# Patient Record
Sex: Female | Born: 1990 | Race: White | Hispanic: No | Marital: Single | Smoking: Never smoker
Health system: Southern US, Community
[De-identification: ages and names within clinical notes are randomized; demographics above are authoritative.]

---
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
## 2012-01-11 ENCOUNTER — Emergency Department (INDEPENDENT_AMBULATORY_CARE_PROVIDER_SITE_OTHER)
Admission: EM | Admit: 2012-01-11 | Discharge: 2012-01-11 | Disposition: A | Discharge status: Home / Self Care | Payer: 59 | Source: Home / Self Care | Attending: Family Medicine | Admitting: Family Medicine

## 2012-01-11 ENCOUNTER — Encounter: Payer: Self-pay | Admitting: *Deleted

## 2012-01-11 DIAGNOSIS — H6690 Otitis media, unspecified, unspecified ear: Secondary | ICD-10-CM

## 2012-01-11 DIAGNOSIS — J069 Acute upper respiratory infection, unspecified: Secondary | ICD-10-CM

## 2012-01-11 DIAGNOSIS — H6693 Otitis media, unspecified, bilateral: Secondary | ICD-10-CM

## 2012-01-11 MED ORDER — BENZONATATE 200 MG PO CAPS: 200.0000 mg | ORAL_CAPSULE | Freq: Every day | ORAL | Status: AC

## 2012-01-11 MED ORDER — AMOXICILLIN 875 MG PO TABS: 875.0000 mg | ORAL_TABLET | Freq: Two times a day (BID) | ORAL | Status: AC

## 2012-01-11 NOTE — ED Notes (Signed)
Patient c/o dry cough, congestion, ear pain and sore throat x 3 days. Taken tylenol cold/flu OTC. Fever 2 days ago. Did not receive a flu vaccine this year.

## 2012-01-11 NOTE — ED Provider Notes (Signed)
History     CSN: [id number]  Arrival date & time 01/11/12  1123   First MD Initiated Contact with Patient 01/11/12 1312      Chief Complaint  Patient presents with  . URI     HPI Comments: Patient complains of approximately 3 day history of gradually progressive URI symptoms beginning with a mild sore throat (now improved), followed by progressive nasal congestion.  A cough started next.  Complains of fatigue but no myalgias.  Cough is now worse at night and generally non-productive during the day.  There has been no pleuritic pain, shortness of breath, or wheezes. She has now developed a left earache. She has a history of otitis media as a child.  Patient is a 21 y.o. female presenting with URI. The history is provided by the patient.  URI The primary symptoms include fatigue, headaches, ear pain, sore throat and cough. Primary symptoms do not include fever, wheezing, abdominal pain, nausea, vomiting, myalgias or rash. Episode onset: 3 days ago.  Symptoms associated with the illness include chills, plugged ear sensation, congestion and rhinorrhea. The illness is not associated with facial pain or sinus pressure.    History reviewed. No pertinent past medical history.  History reviewed. No pertinent past surgical history.  Family History  Problem Relation Age of Onset  . Thrombosis Father     History  Substance Use Topics  . Smoking status: Never Smoker   . Smokeless tobacco: Not on file  . Alcohol Use: No    OB History    Grav Para Term Preterm Abortions TAB SAB Ect Mult Living                  Review of Systems  Constitutional: Positive for chills and fatigue. Negative for fever.  HENT: Positive for ear pain, congestion, sore throat and rhinorrhea. Negative for sinus pressure.   Respiratory: Positive for cough. Negative for wheezing.   Gastrointestinal: Negative for nausea, vomiting and abdominal pain.  Musculoskeletal: Negative for myalgias.  Skin: Negative for  rash.  Neurological: Positive for headaches.   + sore throat + cough No pleuritic pain No wheezing + nasal congestion ? post-nasal drainage No sinus pain/pressure No itchy/red eyes + earache No hemoptysis No SOB No fever, + chills No nausea No vomiting No abdominal pain No diarrhea No urinary symptoms No skin rashes + fatigue No myalgias + headache Used OTC meds without relief  Allergies  Review of patient's allergies indicates no known allergies.  Home Medications   Current Outpatient Rx  Name Route Sig Dispense Refill  . AMOXICILLIN 875 MG PO TABS Oral Take 1 tablet (875 mg total) by mouth 2 (two) times daily. 20 tablet 0  . BENZONATATE 200 MG PO CAPS Oral Take 1 capsule (200 mg total) by mouth at bedtime. Take as needed for cough 12 capsule 0    BP 108/70  Pulse 102  Temp(Src) 98.9 F (37.2 C) (Oral)  Resp 16  Ht 5\' 7"  (1.702 m)  Wt 127 lb (57.607 kg)  BMI 19.89 kg/m2  SpO2 97%  Physical Exam Nursing notes and Vital Signs reviewed. Appearance:  Patient appears healthy, stated age, and in no acute distress Eyes:  Pupils are equal, round, and reactive to light and accomodation.  Extraocular movement is intact.  Conjunctivae are not inflamed  Ears:  Canals normal.   Right TM pink.  Left TM red and slightly bulging Nose:  Mildly congested turbinates.  No sinus tenderness.   Pharynx:  Normal Neck:  Supple.  Slightly tender shotty anterior/posterior nodes are palpated bilaterally  Lungs:  Clear to auscultation.  Breath sounds are equal.  Heart:  Regular rate and rhythm without murmurs, rubs, or gallops.  Abdomen:  Nontender without masses or hepatosplenomegaly.  Bowel sounds are present.  No CVA or flank tenderness.  Extremities:  No edema.  No calf tenderness Skin:  No rash present.   ED Course  Procedures  none      1. Acute upper respiratory infections of unspecified site   2. Otitis media of both ears       MDM  Begin amoxicillin for 10 days,  and Tessalon at bedtime. Take Mucinex D (guaifenesin with decongestant) twice daily for congestion.  Increase fluid intake, rest. May use Afrin nasal spray (or generic oxymetazoline) twice daily for about 5 days.  Also recommend using saline nasal spray several times daily and saline nasal irrigation. Stop all antihistamines for now, and other non-prescription cough/cold preparations. Follow-up with family doctor if not improving 7 to 10 days.         Donna Christen, MD 01/11/12 1335

## 2012-10-08 IMAGING — CR DG ANKLE COMPLETE 3+V*R*
3 series · 3 of 3 positions shown · non-contrast
Comparison: None.

CLINICAL DATA: Tenderness

RIGHT ANKLE - COMPLETE 3+ VIEW

[view not recorded (1 of 3)]
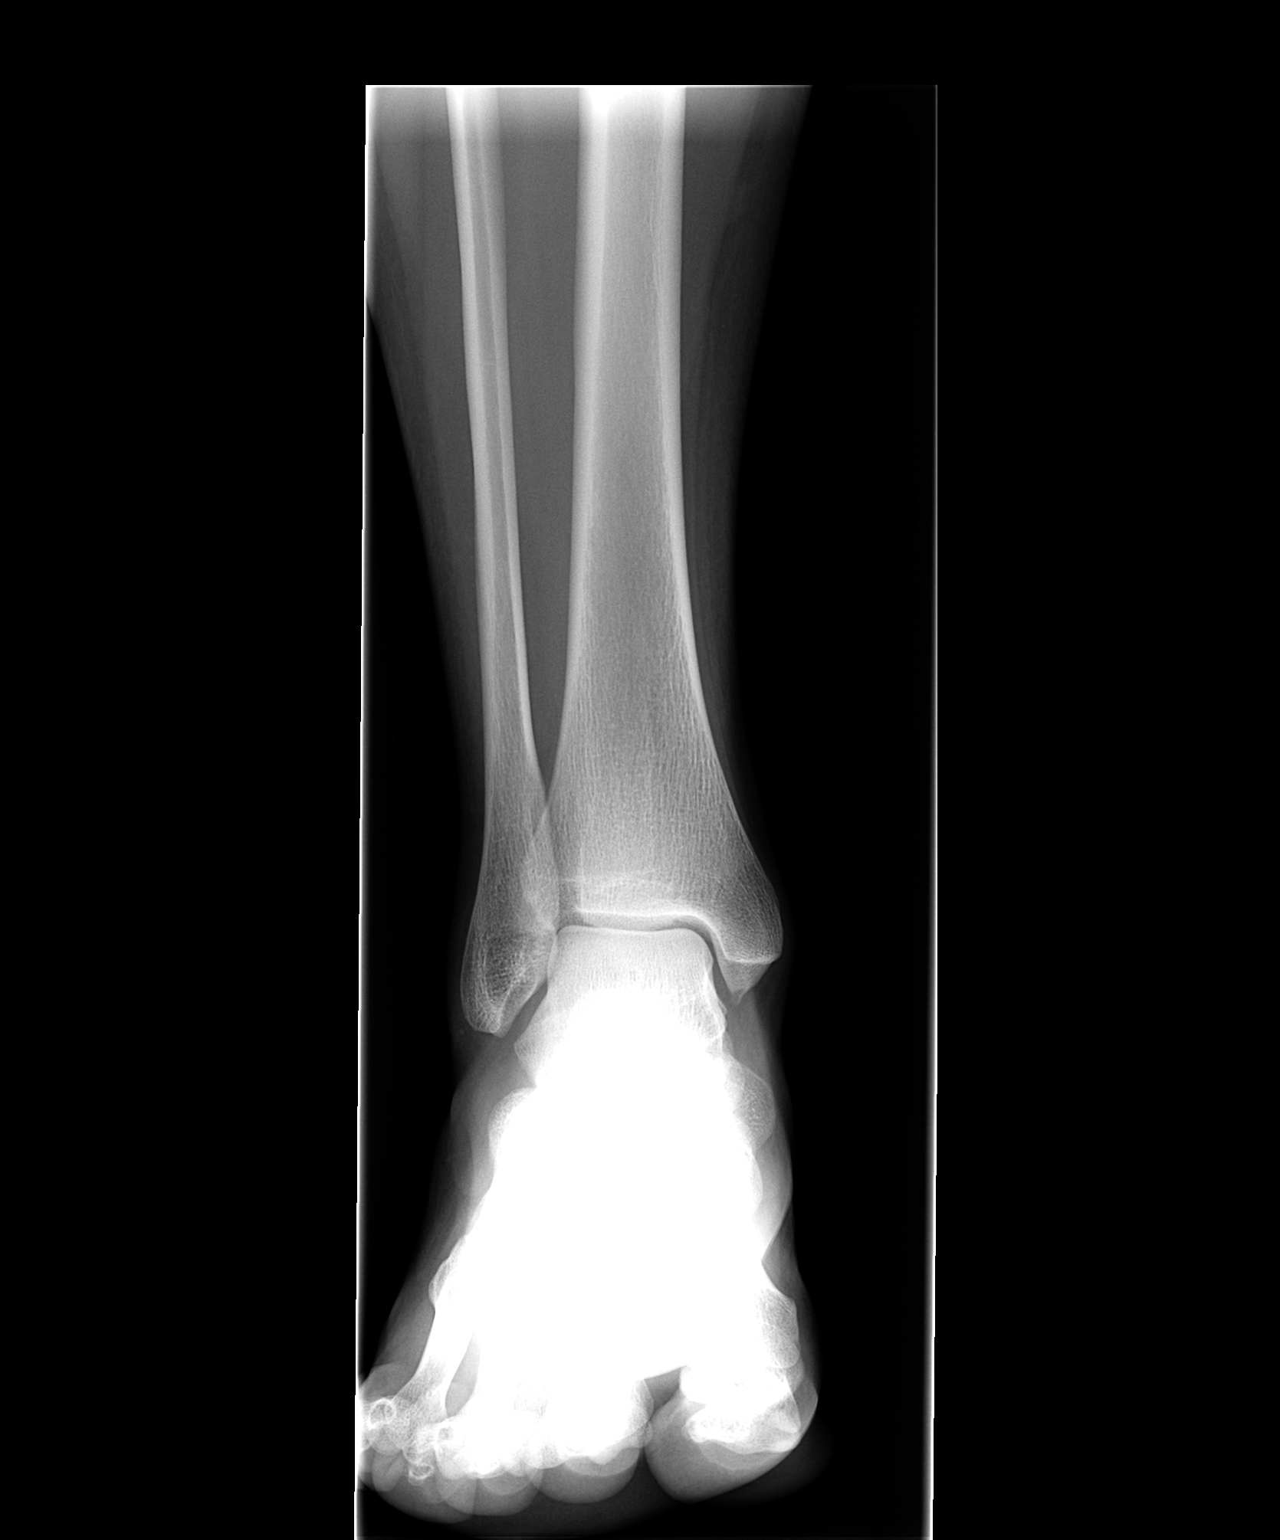

[view not recorded (2 of 3)]
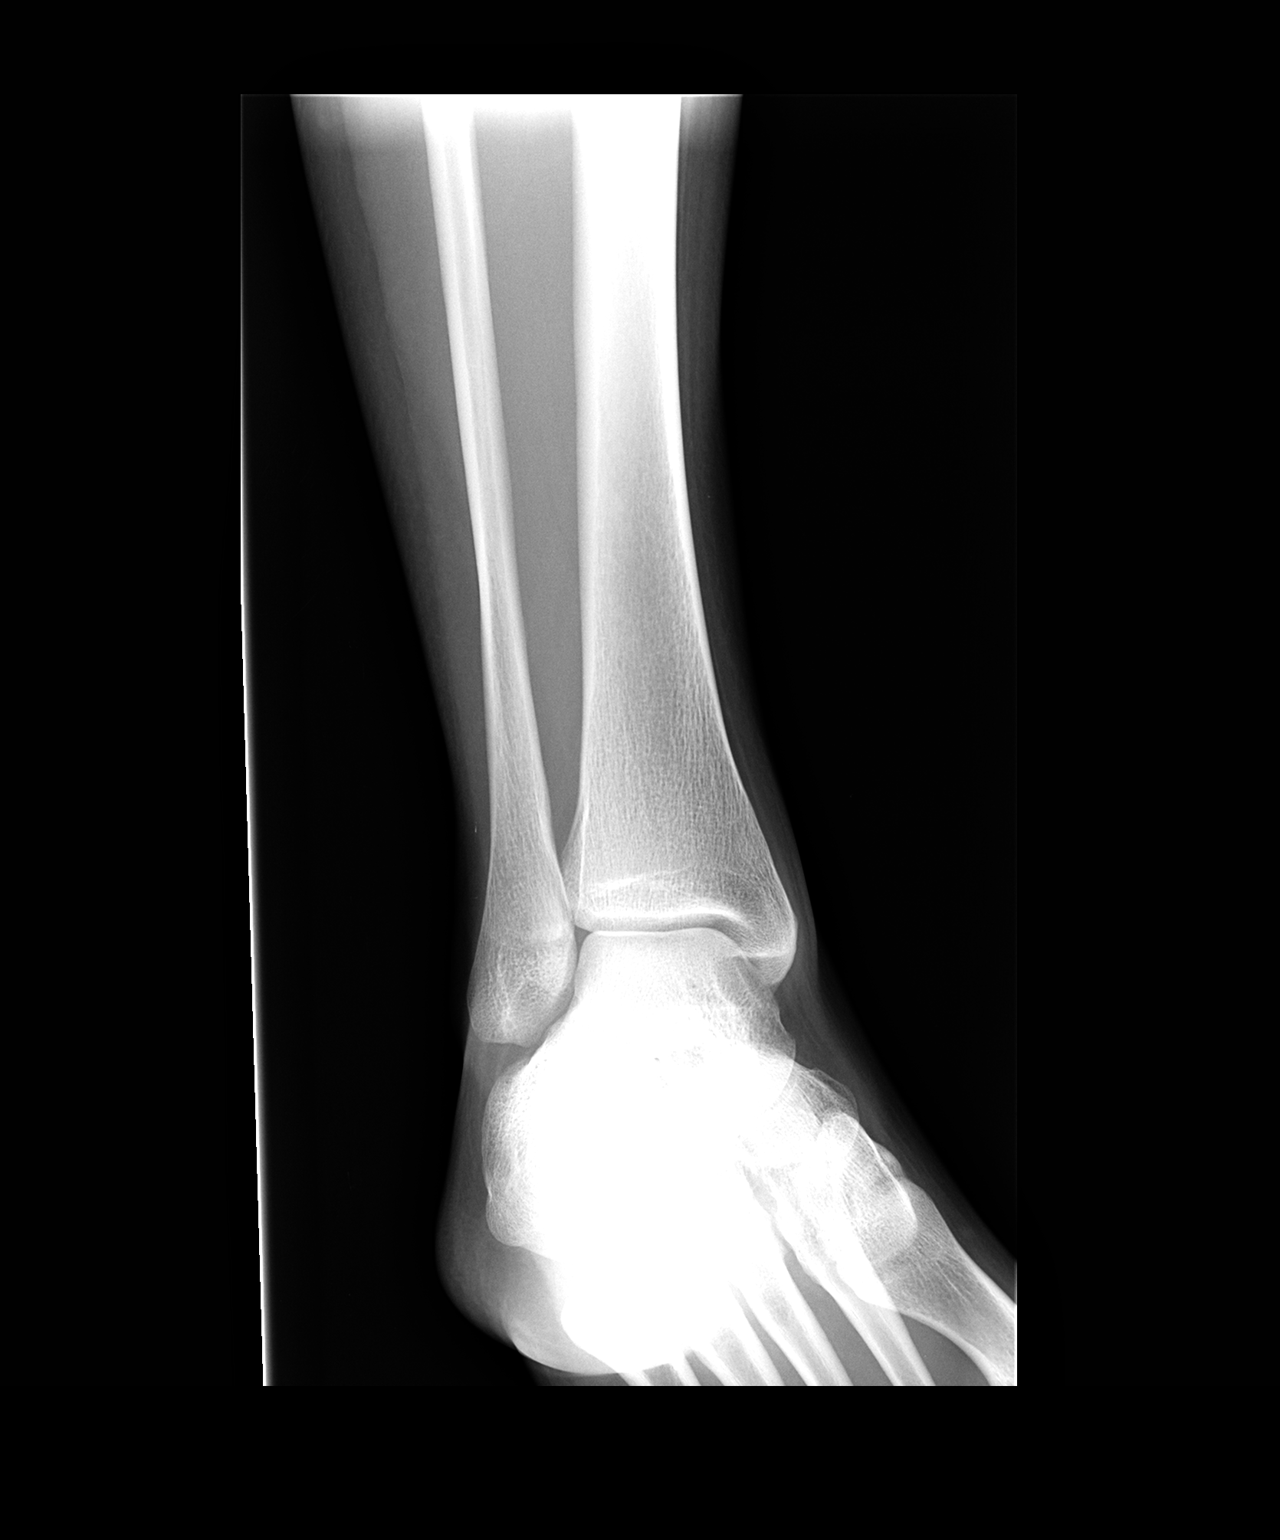

[view not recorded (3 of 3)]
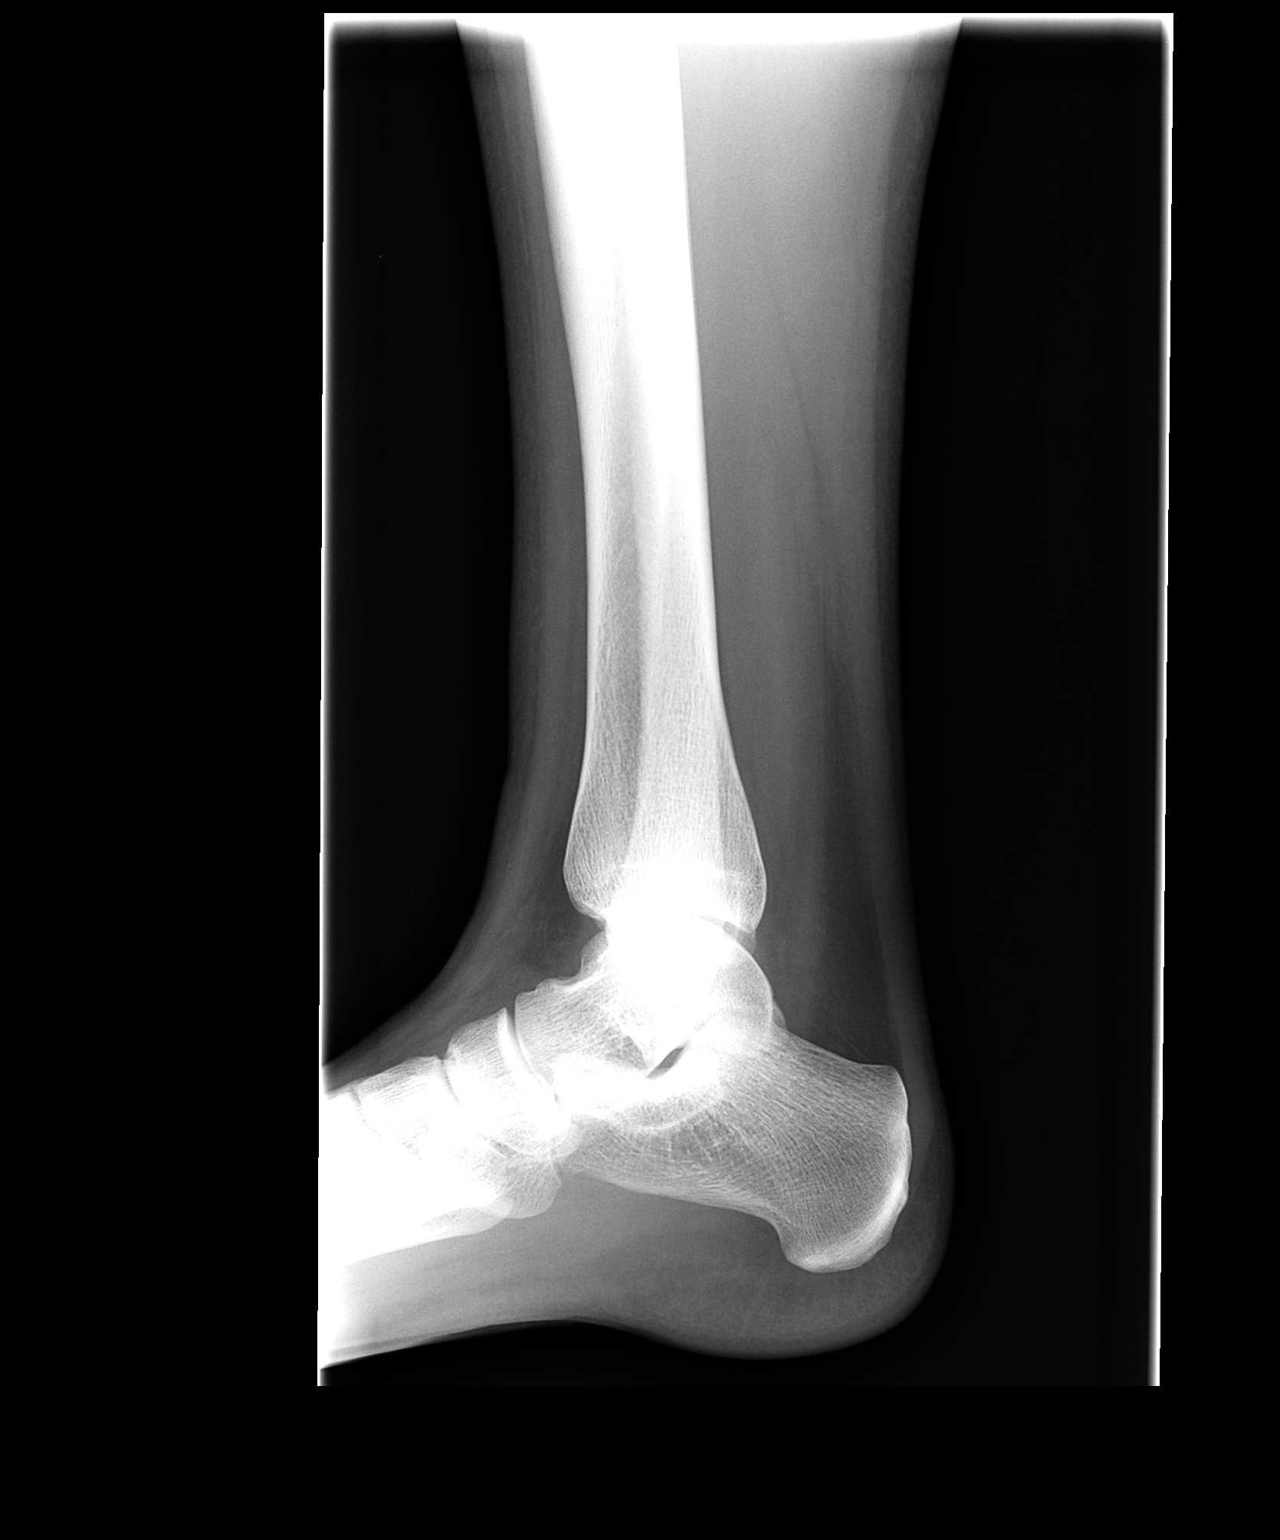

[3 of 3 positions shown; findings below may reference images not displayed]

FINDINGS: No acute fracture.  No dislocation.
IMPRESSION: Negative.

## 2014-10-07 ENCOUNTER — Encounter: Payer: Self-pay | Admitting: Emergency Medicine

## 2014-10-07 ENCOUNTER — Emergency Department
Admission: EM | Admit: 2014-10-07 | Discharge: 2014-10-07 | Disposition: A | Discharge status: Home / Self Care | Payer: 59 | Source: Home / Self Care

## 2014-10-07 DIAGNOSIS — H65193 Other acute nonsuppurative otitis media, bilateral: Secondary | ICD-10-CM

## 2014-10-07 MED ORDER — ANTIPYRINE-BENZOCAINE 5.4-1.4 % OT SOLN: 3.0000 [drp] | OTIC | Status: AC | PRN

## 2014-10-07 MED ORDER — CEFDINIR 300 MG PO CAPS: 300.0000 mg | ORAL_CAPSULE | Freq: Two times a day (BID) | ORAL | Status: AC

## 2014-10-07 NOTE — ED Provider Notes (Signed)
Abigail Snow is a 23 y.o. female who presents to Urgent Care today for ear pain. Patient has a one-day history of bilateral ear pain and decreased hearing. She also notes runny nose coughing congestion over the past several days. She received a flu vaccination 4 days ago. She notes a mild subjective fevers while. 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 She has a recent history 2 years ago of bilateral acute otitis media as an adult.   History reviewed. No pertinent past medical history. History  Substance Use Topics  . Smoking status: Never Smoker   . Smokeless tobacco: Not on file  . Alcohol Use: No   ROS as above Medications: No current facility-administered medications for this encounter.   Current Outpatient Prescriptions  Medication Sig Dispense Refill  . antipyrine-benzocaine (AURALGAN) otic solution Place 3-4 drops into both ears every 2 (two) hours as needed for ear pain.  10 mL  0  . cefdinir (OMNICEF) 300 MG capsule Take 1 capsule (300 mg total) by mouth 2 (two) times daily.  20 capsule  0    Exam:  BP 107/72  Pulse 79  Temp(Src) 97.8 F (36.6 C) (Oral)  Resp 16  Ht 5\' 7"  (1.702 m)  Wt 135 lb (61.236 kg)  BMI 21.14 kg/m2  SpO2 99%  LMP 10/04/2014 Gen: Well NAD HEENT: EOMI,  MMM right tympanic membrane with effusion without significant erythema. Left tympanic membrane bulging with effusion and erythema. Nontender mastoids bilaterally Lungs: Normal work of breathing. CTABL Heart: RRR no MRG Abd: NABS, Soft. Nondistended, Nontender Exts: Brisk capillary refill, warm and well perfused.   No results found for this or any previous visit (from the past 24 hour(s)). No results found.  Assessment and Plan: 23 y.o. female with acute otitis media of the left ear. Plan to treat with Omnicef and Auralgan. Tylenol as needed. Recommend followup with PCP and consider followup with ear nose and throat. Doubtful that flu vaccination had anything  to do with her current symptoms.  Discussed warning signs or symptoms. Please see discharge instructions. Patient expresses understanding.     Rodolph BongEvan S Cristalle Rohm, MD 10/07/14 [phone]

## 2014-10-07 NOTE — ED Notes (Signed)
Reports bilateral ear pain starting last night; did have Flu vaccination 10-04-2014 and has had low grade fever.

## 2014-10-07 NOTE — Discharge Instructions (Signed)
Thank you for coming in today. Take antibiotics as directed. He Use the eardrops for pain as needed. Use Tylenol or ibuprofen for pain as needed. Followup with your primary care doctor.  Otitis Media Otitis media is redness, soreness, and inflammation of the middle ear. Otitis media may be caused by allergies or, most commonly, by infection. Often it occurs as a complication of the common cold. SIGNS AND SYMPTOMS Symptoms of otitis media may include:  Earache.  Fever.  Ringing in your ear.  Headache.  Leakage of fluid from the ear. DIAGNOSIS To diagnose otitis media, your health care provider will examine your ear with an otoscope. This is an instrument that allows your health care provider to see into your ear in order to examine your eardrum. Your health care provider also will ask you questions about your symptoms. TREATMENT  Typically, otitis media resolves on its own within 3-5 days. Your health care provider may prescribe medicine to ease your symptoms of pain. If otitis media does not resolve within 5 days or is recurrent, your health care provider may prescribe antibiotic medicines if he or she suspects that a bacterial infection is the cause. HOME CARE INSTRUCTIONS   If you were prescribed an antibiotic medicine, finish it all even if you start to feel better.  Take medicines only as directed by your health care provider.  Keep all follow-up visits as directed by your health care provider. SEEK MEDICAL CARE IF:  You have otitis media only in one ear, or bleeding from your nose, or both.  You notice a lump on your neck.  You are not getting better in 3-5 days.  You feel worse instead of better. SEEK IMMEDIATE MEDICAL CARE IF:   You have pain that is not controlled with medicine.  You have swelling, redness, or pain around your ear or stiffness in your neck.  You notice that part of your face is paralyzed.  You notice that the bone behind your ear (mastoid) is  tender when you touch it. MAKE SURE YOU:   Understand these instructions.  Will watch your condition.  Will get help right away if you are not doing well or get worse. Document Released: 09/19/2004 Document Revised: 05/01/2014 Document Reviewed: 07/12/2013 [facility] Patient Information 2015 [place], [place]. This information is not intended to replace advice given to you by your health care provider. Make sure you discuss any questions you have with your health care provider.
